# Patient Record
Sex: Male | Born: 1980
Health system: Southern US, Community
[De-identification: ages and names within clinical notes are randomized; demographics above are authoritative.]

---

## 2008-08-24 ENCOUNTER — Emergency Department (HOSPITAL_COMMUNITY): Admission: EM | Admit: 2008-08-24 | Discharge: 2008-08-25 | Payer: Self-pay | Admitting: Emergency Medicine

## 2008-08-28 ENCOUNTER — Ambulatory Visit (HOSPITAL_COMMUNITY): Admission: RE | Admit: 2008-08-28 | Discharge: 2008-08-30 | Payer: Self-pay | Admitting: Specialist

## 2010-02-10 IMAGING — CR DG ANKLE COMPLETE 3+V*L*
3 series · 3 of 3 positions shown · non-contrast
Comparison: None

CLINICAL DATA: Fall on stairs.  Ankle trauma and pain.

LEFT ANKLE COMPLETE - 3+ VIEW

[t ankle joint ap left]
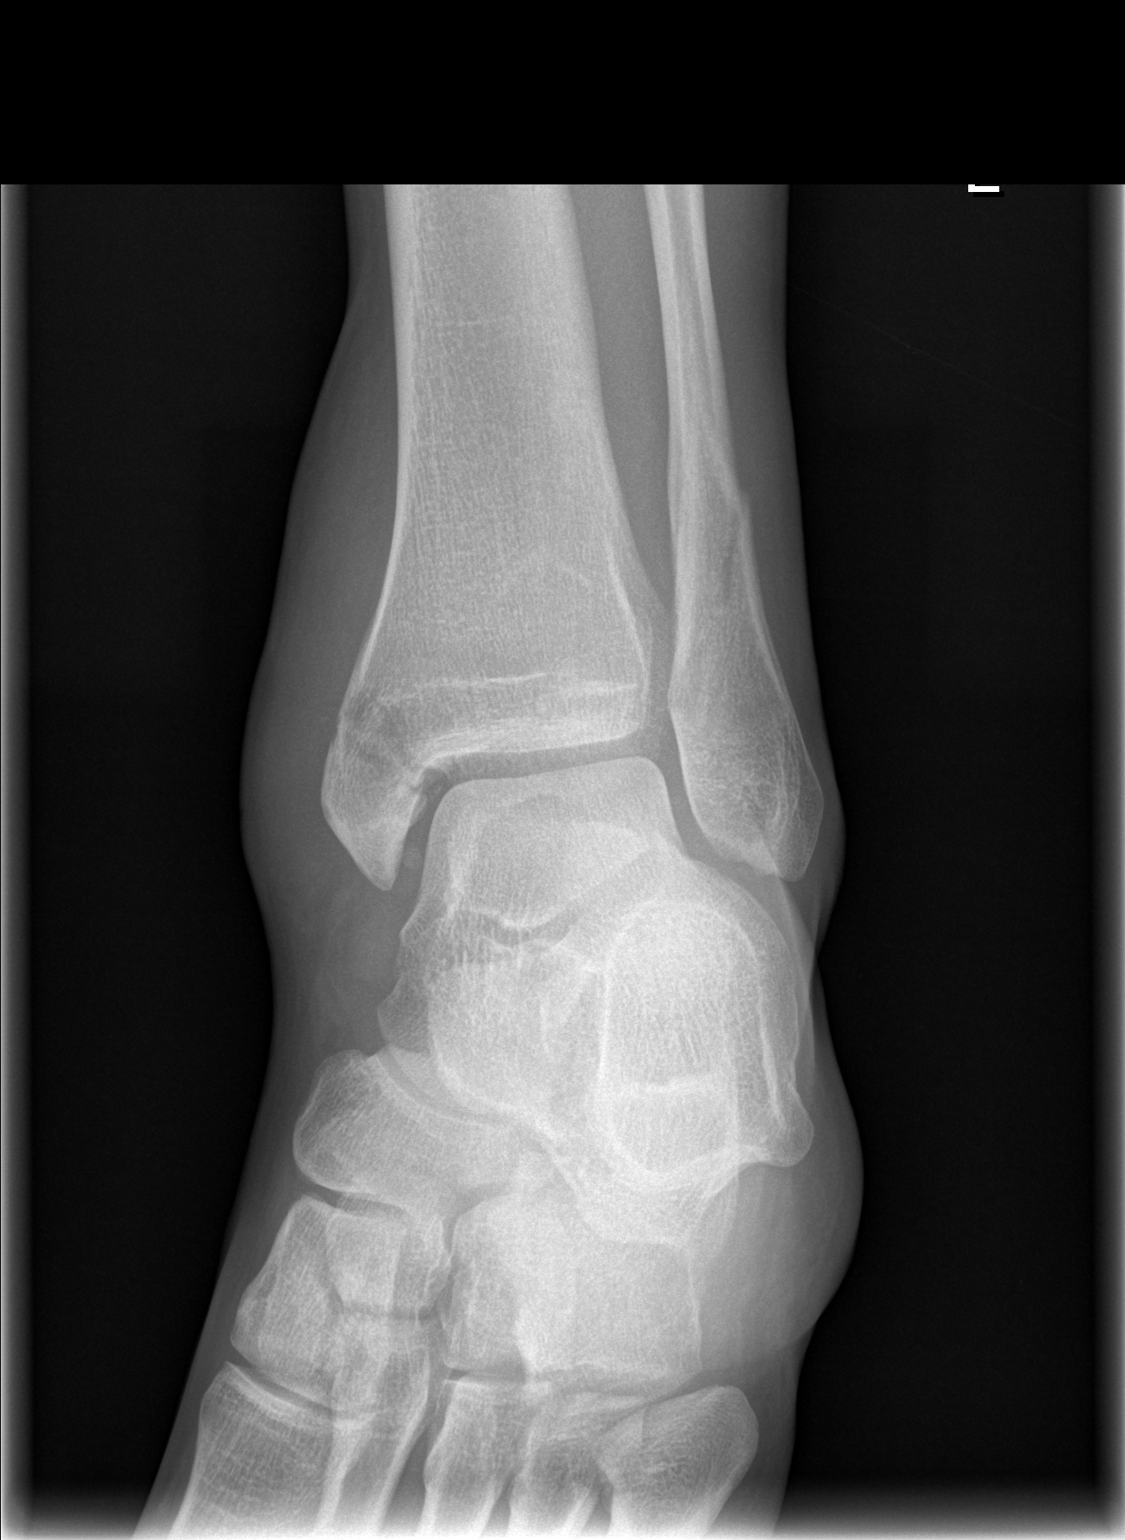

[t ankle joint oblique left]
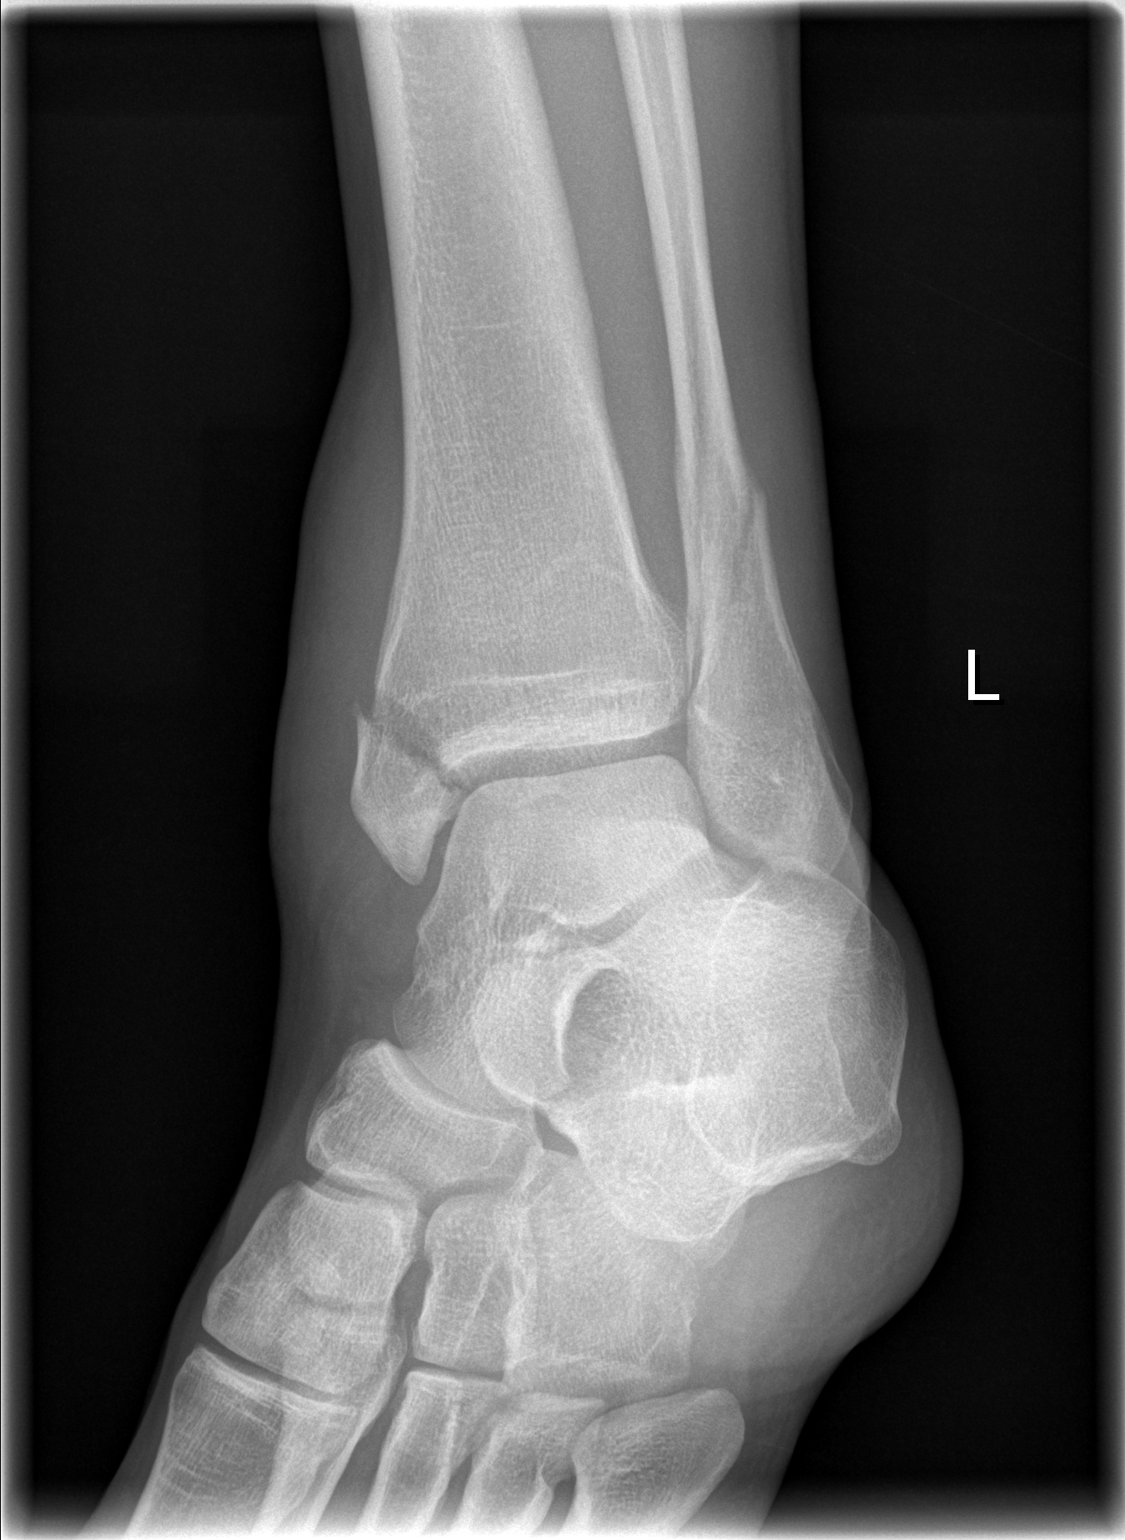

[t ankle joint lat left]
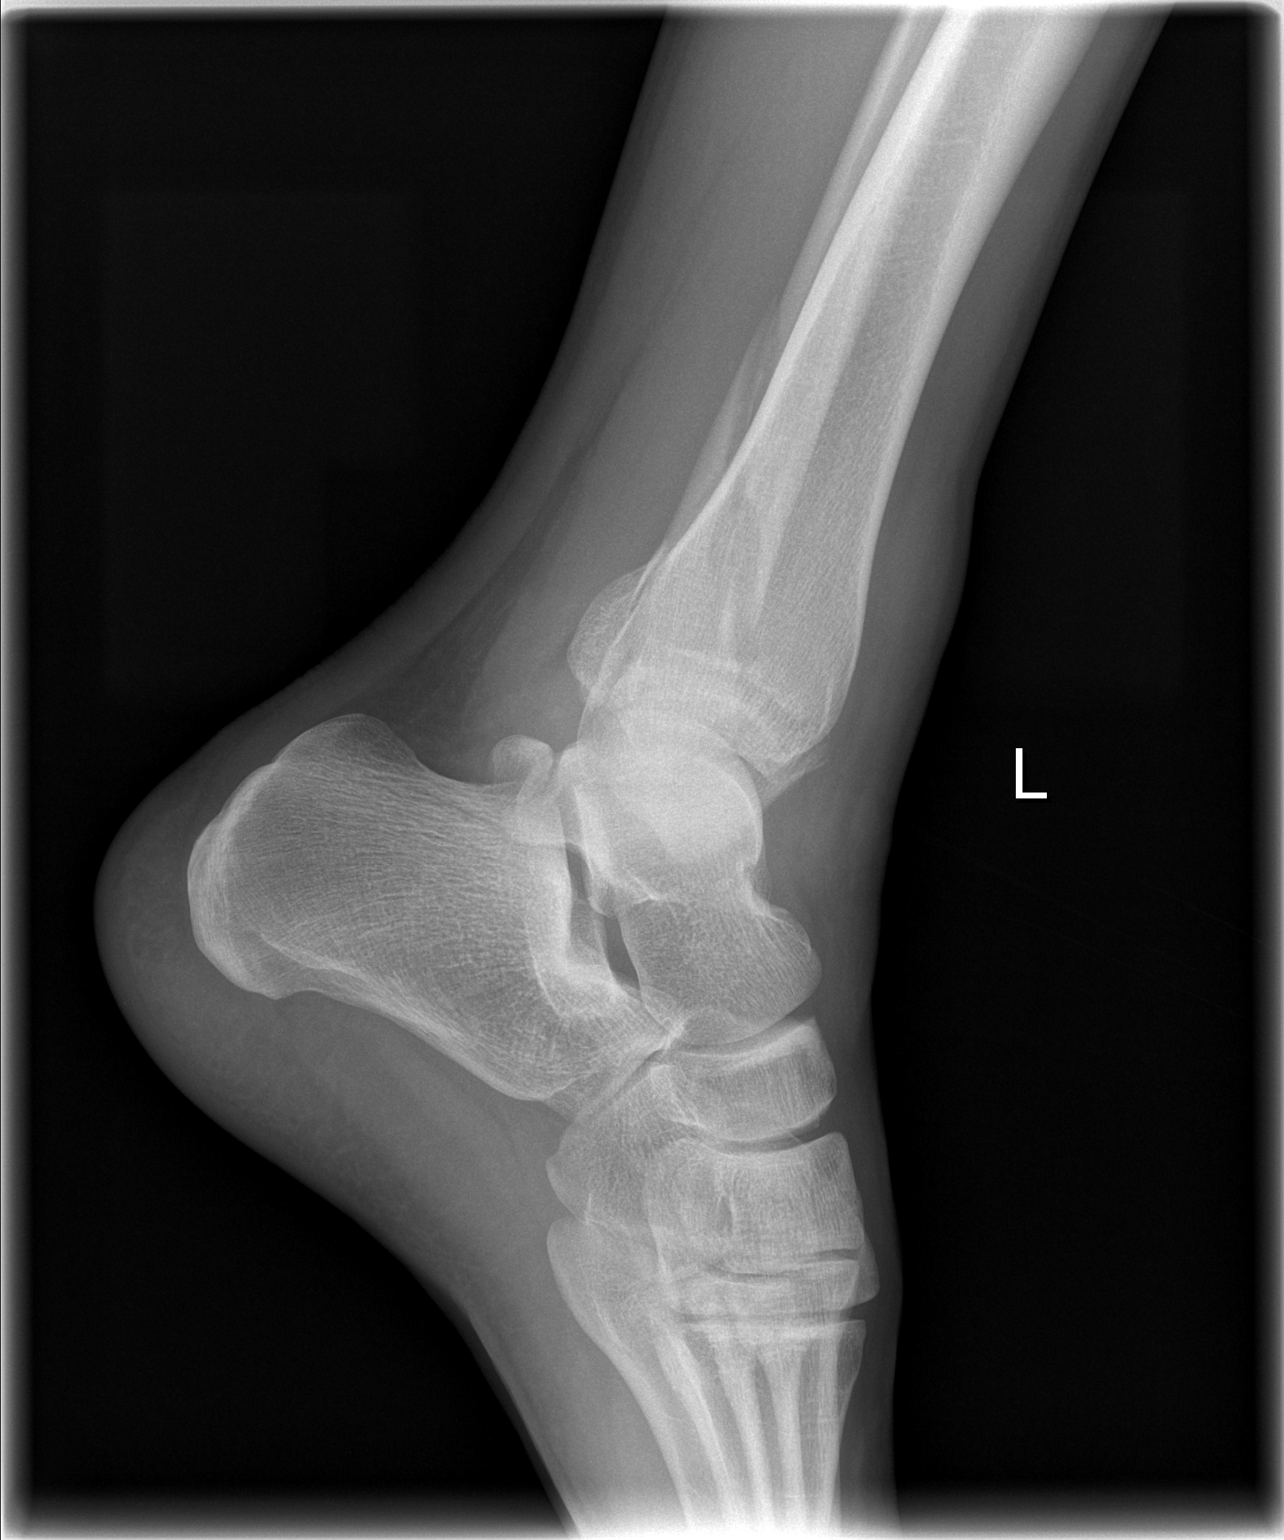

[3 of 3 positions shown; findings below may reference images not displayed]

FINDINGS: A trimalleolar ankle fracture is seen, which shows mild
displacement.  The talus remains centered within the ankle mortise.
Associated soft tissue swelling is noted.
IMPRESSION: Trimalleolar ankle fracture.  No evidence of dislocation.

## 2010-10-25 LAB — COMPREHENSIVE METABOLIC PANEL
AST: 17 U/L (ref 0–37)
Albumin: 3.9 g/dL (ref 3.5–5.2)
BUN: 15 mg/dL (ref 6–23)
CO2: 28 mEq/L (ref 19–32)
Calcium: 9.3 mg/dL (ref 8.4–10.5)
Chloride: 103 mEq/L (ref 96–112)
Glucose, Bld: 95 mg/dL (ref 70–99)
Potassium: 4.4 mEq/L (ref 3.5–5.1)
Sodium: 139 mEq/L (ref 135–145)
Total Protein: 6.5 g/dL (ref 6.0–8.3)

## 2010-10-25 LAB — CBC
HCT: 40.1 % (ref 39.0–52.0)
Hemoglobin: 14 g/dL (ref 13.0–17.0)
RBC: 4.3 MIL/uL (ref 4.22–5.81)
RDW: 12.8 % (ref 11.5–15.5)
WBC: 9 10*3/uL (ref 4.0–10.5)

## 2010-10-25 LAB — APTT: aPTT: 35 seconds (ref 24–37)

## 2010-11-22 NOTE — Op Note (Signed)
Juan Case, Juan Case                ACCOUNT NO.:  [id number]   MEDICAL RECORD NO.:  [id number]          PATIENT TYPE:  OIB   LOCATION:  5004                         FACILITY:  [facility]   PHYSICIAN:  Kerrin Champagne, M.D.   DATE OF BIRTH:  06-25-1981   DATE OF PROCEDURE:  08/28/2008  DATE OF DISCHARGE:                               OPERATIVE REPORT   PREOPERATIVE DIAGNOSIS:  Left trimalleolar ankle fracture, closed.   POSTOPERATIVE DIAGNOSIS:  Left trimalleolar ankle fracture, closed.   PROCEDURE:  Open reduction and internal fixation, left trimalleolar  ankle fracture, medial malleolus with two 4.0 cannulated screws and  lateral malleolus with a 3.5 single lag screw and 6 hole one-third semi-  tubular plate with 5 screws.   SURGEON:  Kerrin Champagne, MD   ASSISTANT:  Wende Neighbors, PA-C   ANESTHESIA:  General via orotracheal intubation.  Dr. Sondra Come supplemented  with popliteal block with Marcaine, and also had local anesthetic  infiltration, 10 mL Marcaine at the end of the case.   ESTIMATED BLOOD LOSS:  20 mL.   COMPLICATIONS:  None.   TOTAL TOURNIQUET TIME:  350 mmHg at 1 hour and 15 minutes.   BRIEF CLINICAL HISTORY:  The patient is a 30 year old male who was  walking downstairs on August 24, 2008, and he proceeded at the bottom  landing, slipped on ice, and twisted his left ankle with immediate pain  and discomfort.  Seen in the emergency room at [facility].  X-rays  taken showed a trimalleolar ankle fracture pattern with minimal  displacement.  He was placed into a posterior splint knee immobilizer,  referred for evaluation, and seen in the office with significant  displacement of medial malleolus and widening of the medial joint line  with trimalleolar ankle pattern.  He is brought to the operating room to  undergo open reduction and internal fixation, plates and screws.  Risks  of surgery have been described to him.  He was seen in the preoperative  holding area, and  the correct side was marked.   DESCRIPTION OF PROCEDURE:  After adequate general anesthesia this  patient had in the preoperative holding area, marking of the correct leg  and time out was carried out in appropriate fashion identifying the  patient's site of procedure and participants involved.  Following  general anesthesia, he had preoperative antibiotics of Ancef.  Standard  prep with DuraPrep solution from the toes to the upper thigh and  tourniquet about the upper thigh bumped under the left buttock to  internally rotate.  Mini C-arm used during the case.  Following prep  with DuraPrep, draped in the usual manner.   Dressing the toes with a sterile glove, the incision lines were drawn  using marking pen, both and lateral.  A medial incision was made after  first elevation of the leg, and exsanguination with Esmarch.  Tourniquet  inflated to 350 mmHg.  The incision was approximately 4-5 cm in length  through the skin and subcutaneous layers, curvilinear, flat based,  anterior, and proximal.  Through the skin and subcutaneous layers,  the  distal saphenous vein identified, the saphenous nerve preserved, the  vein itself was ligated and then divided, retracted anteriorly,  periosteum incised overlying the fracture site, and the fracture then  mobilized irrigating the joint then with copious amounts of irrigating  solution.   Attention was then turned to the lateral malleolus fracture with  incision approximately 6-8 cm in length was made overlying superficial  portion of the posterior aspect of the lateral malleolus, from just  about the mid portion of the lateral malleolus proximally through the  skin and subcutaneous layers down to the periosteum of the fibula.  Then  using a periosteal elevator, this was developed both anterior and  posterior.  Fracture site identified, opened with some external rotation  of the leg, debrided of any blood clots in interposed muscle.  The  entire  fracture site was carefully debrided to allow for apposition of  the bone-on-bone.  This was then reduced by longitudinal traction with  internal rotation, and then held in place using reducing forceps as well  as lobster claw bone holding forceps.  A 3.5 drill bit used to drill  superficial cortical hole through the anterior aspect of the proximal  fracture fragment obliquely towards the fracture site that was reduced,  and then the top hat sleeve placed in the deeper cortex and drilled with  a 2.5 drill bit.   This was measured for length, and appropriate size screw then placed  allowing for compression across the fracture site reduction with the  oblique screw.  A 6-hole one-third semi-tubular plate was then carefully  contoured to the fibula placed against fibula held in place with a  lobster claw bone holding forceps.  Drill holes were then carefully  drilled in appropriate size.  Cortical screws were placed over the  proximal three screw holes.  A single screw hole overlying the fracture  site was left open.  Two distal screw holes were then used to fill the  proximal to distal screw holes with a cortical screw, and then the most  distal with a cancellous screw fixing the fracture site in anatomic  positional alignment.  This completed the ORIF of lateral malleolus.  Returning to medial malleolus then, fracture site was carefully  irrigated and debrided of any interposed cartilaginous material that was  loose, and the joint was irrigated and debrided.  The fracture was then  carefully reduced, and held in place using reducing tenaculum.  Observed  on C-arm fluoro to be in excellent positional alignment, and then two  guidepins were then passed for the 4.0 cannulated screws, both parallel  and directed posteriorly and superiorly measuring about 45 mm in length.   These were then overdrilled, first the anterior drill hole was  overdrilled with the 2.7 cannulated drill, then tapped,  and the  superficial cortex was carefully step cut or undercut using the counter  sink.  Then, the appropriate 4.5 screw was placed.  The posterior screw  was then also overdrilled, then step cut with a counter sink, and again  a 45 mm length screw was used to fix this fracture as well.  Observed on  C-arm fluoro to be in good positional alignment, the posterior of the  two screws appeared to be more prominent, but closely placed to the  cortex along the posterior aspect of the tibia fixing the posterior  malleolus fracture fragment.  There were two screws along the proximal  portion of the plate, and it appeared to  be two cortices or so in  length, but appeared to be well within bone and obtaining an excellent  purchase on the fibula, so these were left in place.   With this then, irrigation was carried out over the medial incision  site.  The periosteum was reapproximated with interrupted 2-0 Vicryl  sutures.  The subcutaneous layer was approximated with interrupted 2-0  Vicryl sutures, and the skin was closed with regular stainless steel  staples.  Lateral incision was irrigated with copious amounts of  irrigant solution.  Deep periosteal layers were approximated with  interrupted 0 Vicryl suture, more superficial layers with interrupted 0  Vicryl sutures, the subcutaneous areas with interrupted 2-0 Vicryl  sutures, and the skin closed with stainless steel staples.  The patient  then had Adaptic, 4 x 4's, ABD pads fixed to the skin, following  infiltration  of the ankle joint and subcutaneous areas both medial and lateral with  Marcaine 0.5% plain.  A well-padded posterior short-leg splint was then  applied.  The patient was reactivated, extubated, and returned to the  recovery room in satisfactory condition.  All instruments and sponge  counts were correct.      Kerrin Champagne, M.D.  Electronically Signed     JEN/MEDQ  D:  08/28/2008  T:  08/29/2008  Job:  [id number]

## 2018-04-23 DIAGNOSIS — R251 Tremor, unspecified: Secondary | ICD-10-CM | POA: Diagnosis not present

## 2018-04-23 DIAGNOSIS — L989 Disorder of the skin and subcutaneous tissue, unspecified: Secondary | ICD-10-CM | POA: Diagnosis not present

## 2018-04-23 DIAGNOSIS — Z23 Encounter for immunization: Secondary | ICD-10-CM | POA: Diagnosis not present

## 2018-04-23 DIAGNOSIS — Z13220 Encounter for screening for lipoid disorders: Secondary | ICD-10-CM | POA: Diagnosis not present

## 2018-04-23 DIAGNOSIS — Z0000 Encounter for general adult medical examination without abnormal findings: Secondary | ICD-10-CM | POA: Diagnosis not present

## 2018-05-28 DIAGNOSIS — D2230 Melanocytic nevi of unspecified part of face: Secondary | ICD-10-CM | POA: Diagnosis not present

## 2018-05-28 DIAGNOSIS — D225 Melanocytic nevi of trunk: Secondary | ICD-10-CM | POA: Diagnosis not present

## 2018-05-28 DIAGNOSIS — D485 Neoplasm of uncertain behavior of skin: Secondary | ICD-10-CM | POA: Diagnosis not present

## 2018-05-28 DIAGNOSIS — D224 Melanocytic nevi of scalp and neck: Secondary | ICD-10-CM | POA: Diagnosis not present

## 2018-06-21 ENCOUNTER — Encounter: Payer: Self-pay | Admitting: Plastic Surgery

## 2018-06-21 ENCOUNTER — Ambulatory Visit: Payer: BLUE CROSS/BLUE SHIELD | Admitting: Plastic Surgery

## 2018-06-21 VITALS — BP 128/75 | HR 68 | Resp 12 | Ht 74.0 in | Wt 187.0 lb

## 2018-06-21 DIAGNOSIS — L819 Disorder of pigmentation, unspecified: Secondary | ICD-10-CM | POA: Diagnosis not present

## 2018-06-21 NOTE — Progress Notes (Signed)
     Patient ID: Juan Case, male    DOB: 1981-05-05, 37 y.o.   MRN: [id number]   Chief Complaint  Patient presents with  . Skin Problem    The patient is a 37 year old male who is here for evaluation of a changing skin lesion of his right cheek patient has had previous concerning lesions that have been removed by a dermatologist.  He has not had skin cancer but has a lot of nevi.  The area on the right cheek is 1.5 cm has some slight pigmentation and is raised.  The biggest issue is that he finds it sore and often bleeding especially after shaving.  Otherwise healthy.  He occasionally uses tobacco.  He has not had any recent illnesses.  Nothing makes this better and it seems to be getting larger with time.   Review of Systems  Constitutional: Negative.   HENT: Negative.   Eyes: Negative.   Respiratory: Negative.   Cardiovascular: Negative.  Negative for leg swelling.  Endocrine: Negative.  Negative for cold intolerance.  Genitourinary: Negative.   Musculoskeletal: Negative.   Skin: Negative for color change, pallor and wound.  Psychiatric/Behavioral: Negative.     History reviewed. No pertinent past medical history.  History reviewed. No pertinent surgical history.   No current outpatient medications on file.   Objective:   Vitals:   06/21/18 1026  BP: 128/75  Pulse: 68  Resp: 12  SpO2: 98%    Physical Exam Constitutional:      Appearance: Normal appearance.  HENT:     Head: Normocephalic and atraumatic.   Neck:     Musculoskeletal: Normal range of motion.  Cardiovascular:     Pulses: Normal pulses.  Pulmonary:     Effort: Pulmonary effort is normal.  Skin:    General: Skin is warm.  Neurological:     General: No focal deficit present.     Mental Status: He is alert and oriented to person, place, and time.  Psychiatric:        Mood and Affect: Mood normal.        Thought Content: Thought content normal.     Assessment & Plan:  Changing pigmented skin  lesion Recommend radiology changing skin path evaluation.  Patient is in agreement.  We will develop a 10 cc syringes with first 10 cc syringe with saline.  Combined with saline is just a matter which is cheaper that or the syringe with saline what you need for cheaper  Alena Billslaire S Nasri Boakye, DO

## 2018-09-06 ENCOUNTER — Telehealth: Payer: Self-pay | Admitting: Plastic Surgery

## 2018-09-06 NOTE — Telephone Encounter (Signed)
Attempted to contact patient via phone to schedule in office procedure. Unable to do so. Sent patient contact letter and received it back as undeliverable.

## 2019-04-28 DIAGNOSIS — K625 Hemorrhage of anus and rectum: Secondary | ICD-10-CM | POA: Diagnosis not present

## 2019-04-28 DIAGNOSIS — E7800 Pure hypercholesterolemia, unspecified: Secondary | ICD-10-CM | POA: Diagnosis not present

## 2019-04-28 DIAGNOSIS — Z0000 Encounter for general adult medical examination without abnormal findings: Secondary | ICD-10-CM | POA: Diagnosis not present

## 2019-04-28 DIAGNOSIS — Z23 Encounter for immunization: Secondary | ICD-10-CM | POA: Diagnosis not present

## 2019-09-16 ENCOUNTER — Encounter: Payer: Self-pay | Admitting: Plastic Surgery

## 2019-09-16 ENCOUNTER — Other Ambulatory Visit: Payer: Self-pay

## 2019-09-16 ENCOUNTER — Other Ambulatory Visit (HOSPITAL_COMMUNITY)
Admission: RE | Admit: 2019-09-16 | Discharge: 2019-09-16 | Disposition: A | Discharge status: Home / Self Care | Payer: 59 | Source: Ambulatory Visit | Attending: Plastic Surgery | Admitting: Plastic Surgery

## 2019-09-16 ENCOUNTER — Ambulatory Visit (INDEPENDENT_AMBULATORY_CARE_PROVIDER_SITE_OTHER): Payer: 59 | Admitting: Plastic Surgery

## 2019-09-16 VITALS — BP 118/74 | HR 71 | Temp 97.3°F | Ht 74.0 in | Wt 198.8 lb

## 2019-09-16 DIAGNOSIS — L819 Disorder of pigmentation, unspecified: Secondary | ICD-10-CM

## 2019-09-16 NOTE — Addendum Note (Signed)
Addended by: Peggye Form on: 09/16/2019 10:59 AM   Modules accepted: Orders

## 2019-09-16 NOTE — Progress Notes (Signed)
Procedure Note  Preoperative Dx: changing skin lesion of right cheek  Postoperative Dx: Same  Procedure: Excision of changing skin lesion of right cheek 6 mm  Anesthesia: Lidocaine 1% with 1:100,000 epinepherine   Description of Procedure: Risks and complications were explained to the patient.  Consent was confirmed and the patient understands the risks and benefits.  The potential complications and alternatives were explained and the patient consents.  The patient expressed understanding the option of not having the procedure and the risks of a scar.  Time out was called and all information was confirmed to be correct.    The area was prepped and drapped.  Lidocaine 1% with epinepherine was injected in the subcutaneous area.  After waiting several minutes for the local to take affect a #15 blade was used to excise the area in an eliptical pattern.  A 5-0 Monocryl was used to close the deep layers with simple interrupted stitches.  The skin edges were reapproximated with 5-0 Monocryl subcuticular running closure.  A dressing was applied.  The patient was given instructions on how to care for the area and a follow up appointment.  Kaz tolerated the procedure well and there were no complications. The specimen was sent to pathology.  The 21st Century Cures Act was signed into law in 2016 which includes the topic of electronic health records.  This provides immediate access to information in MyChart.  This includes consultation notes, operative notes, office notes, lab results and pathology reports.  If you have any questions about what you read please let us know at your next visit or call us at the office.  We are right here with you.

## 2019-09-17 LAB — SURGICAL PATHOLOGY

## 2019-09-25 NOTE — Progress Notes (Signed)
Patient is a 39 year old male here for follow-up after excision of a changing skin lesion on his right cheek on 09/16/2019 with Dr. Ulice Bold.  He has a history of multiple nevi.  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  Peripheral and deep margins involved.  No significant atypia and no evidence of malignancy.  Today patient reports he is doing very well.  Denies fever, headache, recent cold symptoms.  Denies pain, redness, drainage from incision.  Incision is healing well, C/D/I.  No signs of infection, redness, drainage, seroma/hematoma.  Suture knots removed today.  May apply Vaseline to the incision.  May begin using Mederma or silicone after next week.  Protect the incision from the sun.  The 21st Century Cures Act was signed into law in 2016 which includes the topic of electronic health records.  This provides immediate access to information in MyChart.  This includes consultation notes, operative notes, office notes, lab results and pathology reports.  If you have any questions about what you read please let us know at your next visit or call us at the office.  We are right here with you.    Follow-up as needed.  Call office with any questions/concerns or if condition worsens.

## 2019-09-26 ENCOUNTER — Other Ambulatory Visit: Payer: Self-pay

## 2019-09-26 ENCOUNTER — Ambulatory Visit: Payer: 59 | Admitting: Plastic Surgery

## 2019-09-26 ENCOUNTER — Encounter: Payer: Self-pay | Admitting: Plastic Surgery

## 2019-09-26 VITALS — BP 114/75 | HR 66 | Temp 97.7°F | Ht 74.0 in | Wt 197.2 lb

## 2019-09-26 DIAGNOSIS — L819 Disorder of pigmentation, unspecified: Secondary | ICD-10-CM

## 2023-06-04 ENCOUNTER — Other Ambulatory Visit (HOSPITAL_COMMUNITY): Payer: Self-pay | Admitting: Sports Medicine

## 2023-06-04 DIAGNOSIS — M25561 Pain in right knee: Secondary | ICD-10-CM

## 2023-07-20 ENCOUNTER — Encounter (HOSPITAL_COMMUNITY): Payer: Self-pay

## 2023-07-20 ENCOUNTER — Ambulatory Visit (HOSPITAL_COMMUNITY): Payer: No Typology Code available for payment source

## 2024-05-27 ENCOUNTER — Encounter: Payer: Self-pay | Admitting: *Deleted
